# Patient Record
Sex: Female | Born: 2000 | Race: White | Hispanic: No | Marital: Single | Smoking: Never smoker
Health system: Southern US, Community
[De-identification: ages and names within clinical notes are randomized; demographics above are authoritative.]

## PROBLEM LIST (undated history)

## (undated) DIAGNOSIS — G43909 Migraine, unspecified, not intractable, without status migrainosus: Secondary | ICD-10-CM

---
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
## 2021-01-25 ENCOUNTER — Emergency Department
Admission: EM | Admit: 2021-01-25 | Discharge: 2021-01-25 | Disposition: A | Discharge status: Home / Self Care | Payer: Medicaid Other | Attending: Emergency Medicine | Admitting: Emergency Medicine

## 2021-01-25 ENCOUNTER — Other Ambulatory Visit: Payer: Self-pay

## 2021-01-25 DIAGNOSIS — R109 Unspecified abdominal pain: Secondary | ICD-10-CM | POA: Insufficient documentation

## 2021-01-25 DIAGNOSIS — Z5321 Procedure and treatment not carried out due to patient leaving prior to being seen by health care provider: Secondary | ICD-10-CM | POA: Diagnosis not present

## 2021-01-25 DIAGNOSIS — R1110 Vomiting, unspecified: Secondary | ICD-10-CM | POA: Diagnosis not present

## 2021-01-25 LAB — URINALYSIS, ROUTINE W REFLEX MICROSCOPIC
Bacteria, UA: NONE SEEN
Bilirubin Urine: NEGATIVE
Glucose, UA: NEGATIVE mg/dL
Ketones, ur: NEGATIVE mg/dL
Nitrite: NEGATIVE
Protein, ur: NEGATIVE mg/dL
Specific Gravity, Urine: 1.014 (ref 1.005–1.030)
pH: 6 (ref 5.0–8.0)

## 2021-01-25 LAB — COMPREHENSIVE METABOLIC PANEL
ALT: 30 U/L (ref 0–44)
AST: 23 U/L (ref 15–41)
Albumin: 4.1 g/dL (ref 3.5–5.0)
Alkaline Phosphatase: 55 U/L (ref 38–126)
Anion gap: 8 (ref 5–15)
BUN: 12 mg/dL (ref 6–20)
CO2: 23 mmol/L (ref 22–32)
Calcium: 9.9 mg/dL (ref 8.9–10.3)
Chloride: 106 mmol/L (ref 98–111)
Creatinine, Ser: 0.76 mg/dL (ref 0.44–1.00)
GFR, Estimated: >60 mL/min (ref >60)
Glucose, Bld: 91 mg/dL (ref 70–99)
Potassium: 3.3 mmol/L — ABNORMAL LOW (ref 3.5–5.1)
Sodium: 137 mmol/L (ref 135–145)
Total Bilirubin: 0.8 mg/dL (ref 0.3–1.2)
Total Protein: 7.9 g/dL (ref 6.5–8.1)

## 2021-01-25 LAB — CBC
HCT: 40.4 % (ref 36.0–46.0)
Hemoglobin: 14.1 g/dL (ref 12.0–15.0)
MCH: 32 pg (ref 26.0–34.0)
MCHC: 34.9 g/dL (ref 30.0–36.0)
MCV: 91.6 fL (ref 80.0–100.0)
Platelets: 319 10*3/uL (ref 150–400)
RBC: 4.41 MIL/uL (ref 3.87–5.11)
RDW: 11.5 % (ref 11.5–15.5)
WBC: 9.2 10*3/uL (ref 4.0–10.5)
nRBC: 0 % (ref 0.0–0.2)

## 2021-01-25 LAB — LIPASE, BLOOD: Lipase: 29 U/L (ref 11–51)

## 2021-01-25 LAB — POC URINE PREG, ED: Preg Test, Ur: NEGATIVE

## 2021-01-25 NOTE — ED Provider Notes (Signed)
Emergency Medicine Provider Triage Evaluation Note  Victoria Kline , a 20 y.o. female  was evaluated in triage.  Pt complains of left lower quadrant abdominal pain with vomiting that started Saturday.  Patient has had some mild increased urinary frequency but denies dysuria or hematuria.  Patient denies possibility of pregnancy stating that she uses the  Review of Systems  Positive: Patient has abdominal pain, vomiting and diarrhea.  Negative: No chest pain, chest tightness or abdominal pain.  Physical Exam  BP 123/88   Pulse 79   Temp 98.6 F (37 C) (Oral)   Resp 16   Ht 5\' 3"  (1.6 m)   Wt 47.6 kg   SpO2 100%   BMI 18.60 kg/m  Gen:   Awake, no distress   Resp:  Normal effort  MSK:   Moves extremities without difficulty  Other:    Medical Decision Making  Medically screening exam initiated at 4:54 PM.  Appropriate orders placed.  Prout was informed that the remainder of the evaluation will be completed by another provider, this initial triage assessment does not replace that evaluation, and the importance of remaining in the ED until their evaluation is complete.     Renee Rival Taft, PA-C 01/25/21 1657    01/27/21, MD 01/26/21 1500

## 2021-01-25 NOTE — ED Triage Notes (Signed)
Pt to ED for left mid abdominal pain and emesis since Saturday. Denies diarrhea.

## 2021-02-02 ENCOUNTER — Emergency Department: Payer: Medicaid Other

## 2021-02-02 ENCOUNTER — Other Ambulatory Visit: Payer: Self-pay

## 2021-02-02 ENCOUNTER — Emergency Department
Admission: EM | Admit: 2021-02-02 | Discharge: 2021-02-02 | Disposition: A | Discharge status: Home / Self Care | Payer: Medicaid Other | Attending: Emergency Medicine | Admitting: Emergency Medicine

## 2021-02-02 ENCOUNTER — Encounter: Payer: Self-pay | Admitting: Emergency Medicine

## 2021-02-02 DIAGNOSIS — R1032 Left lower quadrant pain: Secondary | ICD-10-CM | POA: Diagnosis present

## 2021-02-02 DIAGNOSIS — R102 Pelvic and perineal pain: Secondary | ICD-10-CM

## 2021-02-02 DIAGNOSIS — N132 Hydronephrosis with renal and ureteral calculous obstruction: Secondary | ICD-10-CM | POA: Insufficient documentation

## 2021-02-02 DIAGNOSIS — N200 Calculus of kidney: Secondary | ICD-10-CM

## 2021-02-02 LAB — COMPREHENSIVE METABOLIC PANEL
ALT: 24 U/L (ref 0–44)
AST: 26 U/L (ref 15–41)
Albumin: 4.6 g/dL (ref 3.5–5.0)
Alkaline Phosphatase: 70 U/L (ref 38–126)
Anion gap: 9 (ref 5–15)
BUN: 8 mg/dL (ref 6–20)
CO2: 23 mmol/L (ref 22–32)
Calcium: 9.8 mg/dL (ref 8.9–10.3)
Chloride: 104 mmol/L (ref 98–111)
Creatinine, Ser: 0.87 mg/dL (ref 0.44–1.00)
GFR, Estimated: >60 mL/min (ref >60)
Glucose, Bld: 108 mg/dL — ABNORMAL HIGH (ref 70–99)
Potassium: 3.9 mmol/L (ref 3.5–5.1)
Sodium: 136 mmol/L (ref 135–145)
Total Bilirubin: 0.5 mg/dL (ref 0.3–1.2)
Total Protein: 8.2 g/dL — ABNORMAL HIGH (ref 6.5–8.1)

## 2021-02-02 LAB — URINALYSIS, ROUTINE W REFLEX MICROSCOPIC
Bacteria, UA: NONE SEEN
Bilirubin Urine: NEGATIVE
Glucose, UA: NEGATIVE mg/dL
Ketones, ur: NEGATIVE mg/dL
Nitrite: NEGATIVE
Protein, ur: 30 mg/dL — AB
RBC / HPF: >50 RBC/hpf — ABNORMAL HIGH (ref 0–5)
Specific Gravity, Urine: 1.029 (ref 1.005–1.030)
pH: 5 (ref 5.0–8.0)

## 2021-02-02 LAB — CBC
HCT: 38.8 % (ref 36.0–46.0)
Hemoglobin: 12.9 g/dL (ref 12.0–15.0)
MCH: 30.5 pg (ref 26.0–34.0)
MCHC: 33.2 g/dL (ref 30.0–36.0)
MCV: 91.7 fL (ref 80.0–100.0)
Platelets: 312 10*3/uL (ref 150–400)
RBC: 4.23 MIL/uL (ref 3.87–5.11)
RDW: 11.6 % (ref 11.5–15.5)
WBC: 9.4 10*3/uL (ref 4.0–10.5)
nRBC: 0 % (ref 0.0–0.2)

## 2021-02-02 LAB — LIPASE, BLOOD: Lipase: 31 U/L (ref 11–51)

## 2021-02-02 LAB — POC URINE PREG, ED: Preg Test, Ur: NEGATIVE

## 2021-02-02 MED ORDER — HYDROCODONE-ACETAMINOPHEN 5-325 MG PO TABS: 1.0000 | ORAL_TABLET | Freq: Four times a day (QID) | ORAL | 0 refills | Status: AC | PRN

## 2021-02-02 MED ORDER — ONDANSETRON HCL 4 MG/2ML IJ SOLN
4.0000 mg | Freq: Once | INTRAMUSCULAR | Status: AC
  Administered 2021-02-02: 4 mg via INTRAVENOUS
  Filled 2021-02-02: qty 2

## 2021-02-02 MED ORDER — TAMSULOSIN HCL 0.4 MG PO CAPS: 0.4000 mg | ORAL_CAPSULE | Freq: Every day | ORAL | 0 refills | Status: AC

## 2021-02-02 MED ORDER — LACTATED RINGERS IV BOLUS
1000.0000 mL | Freq: Once | INTRAVENOUS | Status: AC
  Administered 2021-02-02: 1000 mL via INTRAVENOUS

## 2021-02-02 MED ORDER — MORPHINE SULFATE (PF) 4 MG/ML IV SOLN
4.0000 mg | Freq: Once | INTRAVENOUS | Status: AC
  Administered 2021-02-02: 4 mg via INTRAVENOUS
  Filled 2021-02-02: qty 1

## 2021-02-02 MED ORDER — ONDANSETRON HCL 4 MG/2ML IJ SOLN: 4.0000 mg | Freq: Once | INTRAMUSCULAR | Status: DC

## 2021-02-02 MED ORDER — KETOROLAC TROMETHAMINE 30 MG/ML IJ SOLN
15.0000 mg | Freq: Once | INTRAMUSCULAR | Status: AC
  Administered 2021-02-02: 15 mg via INTRAVENOUS
  Filled 2021-02-02: qty 1

## 2021-02-02 MED ORDER — IOHEXOL 300 MG/ML  SOLN
75.0000 mL | Freq: Once | INTRAMUSCULAR | Status: AC | PRN
  Administered 2021-02-02: 75 mL via INTRAVENOUS
  Filled 2021-02-02: qty 75

## 2021-02-02 NOTE — ED Provider Notes (Signed)
[facility] Emergency Department Provider Note  ____________________________________________   Event Date/Time   First MD Initiated Contact with Patient 02/02/21 1050     (approximate)  I have reviewed the triage vital signs and the nursing notes.   HISTORY  Chief Complaint Back Pain and Abdominal Pain   HPI Victoria Kline is a 20 y.o. female with a past medical history of migraine headaches who presents for assessment of some left lower quadrant abdominal pain as well as some right lower back pain and some blood in her urine and some nausea and vomiting today.  Patient states she was diagnosed with UTI couple days ago and has been taking Bactrim as prescribed but feels like her left lower quadrant abdominal pain got worse today and she started vomiting today.  She denies any burning with urination, other abnormal vaginal bleeding or discharge, chest pain, cough, fevers, headache, earache, sore throat, diarrhea, rash or extremity pain.  Patient unsure if she had shortness of breath today from the pain in her abdomen or not.  She does not get regular menstrual periods because she is on the Depo-Provera shot.  No other acute concerns at this time.         Past Medical History:  Diagnosis Date   Migraines     There are no problems to display for this patient.   History reviewed. No pertinent surgical history.  Prior to Admission medications   Medication Sig Start Date End Date Taking? Authorizing Provider  HYDROcodone-acetaminophen (NORCO) 5-325 MG tablet Take 1 tablet by mouth every 6 (six) hours as needed for up to 5 days for severe pain. 02/02/21 02/07/21 Yes Lucrezia Starch, MD  tamsulosin (FLOMAX) 0.4 MG CAPS capsule Take 1 capsule (0.4 mg total) by mouth daily for 5 days. 02/02/21 02/07/21 Yes Lucrezia Starch, MD    Allergies Patient has no known allergies.  No family history on file.  Social History Social History   Tobacco Use   Smoking  status: Never   Smokeless tobacco: Never  Vaping Use   Vaping Use: Never used  Substance Use Topics   Alcohol use: No   Drug use: Yes    Types: Marijuana    Comment: "Edible"    Review of Systems  Review of Systems  Constitutional:  Negative for chills and fever.  HENT:  Negative for sore throat.   Eyes:  Negative for pain.  Respiratory:  Positive for shortness of breath. Negative for cough and stridor.   Cardiovascular:  Negative for chest pain.  Gastrointestinal:  Positive for abdominal pain, nausea and vomiting.  Genitourinary:  Positive for hematuria.  Musculoskeletal:  Positive for back pain.  Skin:  Negative for rash.  Neurological:  Negative for seizures, loss of consciousness and headaches.  Psychiatric/Behavioral:  Negative for suicidal ideas.   All other systems reviewed and are negative.    ____________________________________________   PHYSICAL EXAM:  VITAL SIGNS: ED Triage Vitals  Enc Vitals Group     BP 02/02/21 0955 119/81     Pulse Rate 02/02/21 0955 86     Resp 02/02/21 0955 17     Temp 02/02/21 0955 98 F (36.7 C)     Temp Source 02/02/21 0955 Oral     SpO2 02/02/21 0955 97 %     Weight --      Height --      Head Circumference --      Peak Flow --  Pain Score 02/02/21 0959 8     Pain Loc --      Pain Edu? --      Excl. in [place]? --    Vitals:   02/02/21 0955 02/02/21 1248  BP: 119/81 119/81  Pulse: 86 86  Resp: 17 17  Temp: 98 F (36.7 C)   SpO2: 97% 97%   Physical Exam Vitals and nursing note reviewed.  Constitutional:      General: She is not in acute distress.    Appearance: She is well-developed.  HENT:     Head: Normocephalic and atraumatic.     Right Ear: External ear normal.     Left Ear: External ear normal.     Nose: Nose normal.     Mouth/Throat:     Mouth: Mucous membranes are dry.  Eyes:     Conjunctiva/sclera: Conjunctivae normal.  Cardiovascular:     Rate and Rhythm: Normal rate and regular rhythm.      Heart sounds: No murmur heard. Pulmonary:     Effort: Pulmonary effort is normal. No respiratory distress.     Breath sounds: Normal breath sounds.  Abdominal:     Palpations: Abdomen is soft.     Tenderness: There is generalized abdominal tenderness. There is no right CVA tenderness or left CVA tenderness.  Musculoskeletal:     Cervical back: Neck supple.     Right lower leg: No edema.     Left lower leg: No edema.  Skin:    General: Skin is warm and dry.     Capillary Refill: Capillary refill takes 2 to 3 seconds.  Neurological:     Mental Status: She is alert and oriented to person, place, and time.  Psychiatric:        Mood and Affect: Mood normal.     ____________________________________________   LABS (all labs ordered are listed, but only abnormal results are displayed)  Labs Reviewed  COMPREHENSIVE METABOLIC PANEL - Abnormal; Notable for the following components:      Result Value   Glucose, Bld 108 (*)    Total Protein 8.2 (*)    All other components within normal limits  URINALYSIS, ROUTINE W REFLEX MICROSCOPIC - Abnormal; Notable for the following components:   Color, Urine YELLOW (*)    APPearance HAZY (*)    Hgb urine dipstick MODERATE (*)    Protein, ur 30 (*)    Leukocytes,Ua TRACE (*)    RBC / HPF >50 (*)    All other components within normal limits  URINE CULTURE  LIPASE, BLOOD  CBC  POC URINE PREG, ED   ____________________________________________  EKG  ____________________________________________  RADIOLOGY  ED MD interpretation: Pelvic ultrasound has no evidence of torsion, free fluid adnexal mass or cyst.  CT abdomen pelvis shows some moderate left hydronephrosis from a 3 mm calcified density in distal course of the left ureter consistent with stone causing some obstruction.  Left kidney is enlarged with some decreased cortical enhancement without significant stranding.  Right kidney is unremarkable.  There is no evidence of appendicitis,  diverticulitis, cholecystitis, pancreatitis or other acute abdominal pelvic process.  Official radiology report(s): CT ABDOMEN PELVIS W CONTRAST  Result Date: 02/02/2021 CLINICAL DATA:  Abdominal pain EXAM: CT ABDOMEN AND PELVIS WITH CONTRAST TECHNIQUE: Multidetector CT imaging of the abdomen and pelvis was performed using the standard protocol following bolus administration of intravenous contrast. CONTRAST:  41mL OMNIPAQUE IOHEXOL 300 MG/ML  SOLN COMPARISON:  None. FINDINGS: Lower chest: No significant  abnormality is seen Hepatobiliary: Liver measures 17.2 cm in length. No focal abnormality is seen. Gallbladder is unremarkable. There is no dilation of bile ducts. Pancreas: No focal abnormality is seen Spleen: Unremarkable Adrenals/Urinary Tract: Adrenals are not enlarged. Left kidney is larger than right. There is decreased cortical enhancement in the left kidney in comparison to the right side. There is moderate left hydronephrosis. Left ureter is dilated. In the image 66 of series 2, there is 3 mm calcific density in the distal course of left ureter close to the ureterovesical junction. Dilated ureter is difficult to visualize in the distal course of left ureter in the axial images. In coronal images this calcific density appears to be in the distal course of left ureter, most likely ureteral calculus. There are no demonstrable renal stones. Stomach/Bowel: Stomach is not distended. Small bowel loops are not dilated. Appendix is difficult to visualize. There is a tubular structure with air in the lumen in right pelvic cavity, possibly normal appendix. There is no pericecal inflammation. There is no significant wall thickening in colon. Vascular/Lymphatic: Unremarkable. Reproductive: Unremarkable. Other: There is no ascites or pneumoperitoneum. Musculoskeletal: Unremarkable IMPRESSION: There is moderate left hydronephrosis. There is 3 mm calcific density in the distal course of left ureter close to the  ureterovesical junction, most likely calculus in the distal left ureter causing high-grade obstruction. Left kidney is enlarged in size in comparison to the right with decreased cortical enhancement. This may be due to high-grade distal ureteric obstruction or superimposed pyelonephritis. There is no perinephric fluid collection. There is no evidence of intestinal obstruction or pneumoperitoneum. Electronically Signed   By: Elmer Picker M.D.   On: 02/02/2021 13:10   US PELVIC COMPLETE W TRANSVAGINAL AND TORSION R/O  Result Date: 02/02/2021 CLINICAL DATA:  A 20 year old female presents with LEFT pelvic pain for 1 week. EXAM: TRANSABDOMINAL AND TRANSVAGINAL ULTRASOUND OF PELVIS DOPPLER ULTRASOUND OF OVARIES TECHNIQUE: Both transabdominal and transvaginal ultrasound examinations of the pelvis were performed. Transabdominal technique was performed for global imaging of the pelvis including uterus, ovaries, adnexal regions, and pelvic cul-de-sac. It was necessary to proceed with endovaginal exam following the transabdominal exam to visualize the endometrium as well as LEFT and RIGHT ovary. Color and duplex Doppler ultrasound was utilized to evaluate blood flow to the ovaries. COMPARISON:  None. FINDINGS: Uterus Measurements: 5.8 x 2.5 x 4.3 cm = volume: 32.7 mL. No fibroids or other mass visualized. Endometrium Thickness: 2 mm.  No focal abnormality visualized. Right ovary Measurements: 2.8 x 1.9 x 2.3 cm = volume: 6.2 mL. Normal appearance/no adnexal mass. Left ovary Measurements: 2.2 x 1.3 x 2.5 cm = volume: 3.7 mL. Normal appearance/no adnexal mass. Pulsed Doppler evaluation of both ovaries demonstrates normal low-resistance arterial and venous waveforms. Other findings No abnormal free fluid. IMPRESSION: Normal pelvic sonogram.  No signs of ovarian torsion. Electronically Signed   By: Zetta Bills M.D.   On: 02/02/2021 12:22     ____________________________________________   PROCEDURES  Procedure(s) performed (including Critical Care):  Procedures   ____________________________________________   INITIAL IMPRESSION / ASSESSMENT AND PLAN / ED COURSE      Patient presents with above-stated history exam for assessment of nausea vomiting and left lower quad abdominal pain as well some right lower back pain and hematuria.  On arrival she is afebrile hemodynamically stable.  She does have some mild left lower quadrant abdominal without any significant CVA tenderness  Primary differential includes kidney stone, pyelonephritis, ovarian torsion, ovarian cyst, TOA, diverticulitis, appendicitis  and cystitis.  Pelvic ultrasound has no evidence of torsion, free fluid adnexal mass or cyst.  CT abdomen pelvis shows some moderate left hydronephrosis from a 3 mm calcified density in distal course of the left ureter consistent with stone causing some obstruction.  Left kidney is enlarged with some decreased cortical enhancement without significant stranding.  Right kidney is unremarkable.  There is no evidence of appendicitis, diverticulitis, cholecystitis, pancreatitis or other acute abdominal pelvic process.  Lipase not consistent with acute pancreatitis.  CMP without any significant electrolyte metabolic derangements.  There is no evidence of hepatitis or cholestasis.  CBC shows no leukocytosis or acute anemia.  Pregnancy test is negative.  UA with moderate hemoglobin and some protein and trace leukocyte esterase with urine 50 RBCs.  There are no WBCs, bacteria seen, or nitrites.  Given it has been several days since patient symptoms began and she does not seem to spontaneously passed a stone which I suspect is likely the cause of her symptoms I did reach out to on-call urologist Dr. Lonna Cobb recommended starting patient on empty Zosyn and following up in clinic.  I think this is reasonable as do not believe she is septic at  this time her pain and nausea are well controlled on my assessment.  She is able to tolerate p.o.  Her kidney function is appropriate.  Urine at this time does not appear infected.  Rx for Flomax and analgesia provided.  She states she still has enough Zofran does not need a refill.  Discharged stable condition.  Strict return precautions advised and discussed.  Plan is to follow-up with urology next 2 or 3 days.      ____________________________________________   FINAL CLINICAL IMPRESSION(S) / ED DIAGNOSES  Final diagnoses:  Pelvic pain  Kidney stone    Medications  ondansetron (ZOFRAN) injection 4 mg (4 mg Intravenous Not Given 02/02/21 1211)  morphine 4 MG/ML injection 4 mg (4 mg Intravenous Given 02/02/21 1116)  ondansetron (ZOFRAN) injection 4 mg (4 mg Intravenous Given 02/02/21 1116)  ondansetron (ZOFRAN) injection 4 mg (4 mg Intravenous Given 02/02/21 1213)  lactated ringers bolus 1,000 mL (1,000 mLs Intravenous New Bag/Given 02/02/21 1246)  iohexol (OMNIPAQUE) 300 MG/ML solution 75 mL (75 mLs Intravenous Contrast Given 02/02/21 1252)  ketorolac (TORADOL) 30 MG/ML injection 15 mg (15 mg Intravenous Given 02/02/21 1335)     ED Discharge Orders          Ordered    tamsulosin (FLOMAX) 0.4 MG CAPS capsule  Daily        02/02/21 1333    HYDROcodone-acetaminophen (NORCO) 5-325 MG tablet  Every 6 hours PRN        02/02/21 1333             Note:  This document was prepared using Dragon voice recognition software and may include unintentional dictation errors.    Gilles Chiquito, MD 02/02/21 [phone]

## 2021-02-02 NOTE — Discharge Instructions (Addendum)
I recommend taking 400 mg ibuprofen every 6 hours with using your prescription pain medicine only as needed for breakthrough pain.

## 2021-02-02 NOTE — ED Notes (Signed)
Lab contacted about discontinued urinalysis. Per lab tech, urine needs to be recollected.

## 2021-02-02 NOTE — ED Triage Notes (Signed)
Pt states that she is having Left mid quad abd pain with back pain, the back pain takes her breath away at times. She was seen here about a week ago for the same but left because of the wait, went to [facility] they diagnosised her with a UTI and put on antibiotics that she feels are not helping her.

## 2021-02-04 LAB — URINE CULTURE: Culture: 10000 — AB

## 2021-02-05 NOTE — Progress Notes (Signed)
ED Antimicrobial Stewardship Positive Culture Follow Up   Victoria Kline is an 20 y.o. female who presented to [facility] on 02/02/2021 with a chief complaint of  Chief Complaint  Patient presents with   Back Pain   Abdominal Pain    Recent Results (from the past 720 hour(s))  Urine Culture     Status: Abnormal   Collection Time: 02/02/21 11:03 AM   Specimen: Urine, Random  Result Value Ref Range Status   Specimen Description   Final    URINE, RANDOM Performed at [facility], [address] [place], [place] [address]    Special Requests   Final    NONE Performed at [facility], [address] [place], [place] [address]    Culture 10,000 COLONIES/mL STAPHYLOCOCCUS EPIDERMIDIS (A)  Final   Report Status 02/04/2021 FINAL  Final   Organism ID, Bacteria STAPHYLOCOCCUS EPIDERMIDIS (A)  Final      Susceptibility   Staphylococcus epidermidis - MIC*    CIPROFLOXACIN <=0.5 SENSITIVE Sensitive     GENTAMICIN <=0.5 SENSITIVE Sensitive     NITROFURANTOIN <=16 SENSITIVE Sensitive     OXACILLIN <=0.25 SENSITIVE Sensitive     TETRACYCLINE 4 SENSITIVE Sensitive     VANCOMYCIN 2 SENSITIVE Sensitive     TRIMETH/SULFA 160 RESISTANT Resistant     CLINDAMYCIN <=0.25 SENSITIVE Sensitive     RIFAMPIN <=0.5 SENSITIVE Sensitive     Inducible Clindamycin NEGATIVE Sensitive     * 10,000 COLONIES/mL STAPHYLOCOCCUS EPIDERMIDIS    [x]  Patient discharged originally without antimicrobial agent and treatment may now be indicated.  Found to have ureteral stone.  Discussed case with Dr in ED,  MSSE would be unusual organism in the setting of no catheter or urinary tract procedure, etc.but physician suggested contacted patient to see how she is feeling.  I have attempted to make 3 phone calls today.  A message was left on patient's voicemail.    New antibiotic prescription: none at this time.  Attempting to contact patient.  If signs of infection then will start cephalexin 500mg   QID x 5 days.    ED Provider: Dr Vicente Males, PharmD, BCPS.   Work Cell: [phone] 02/05/2021 11:25 AM

## 2022-05-18 ENCOUNTER — Other Ambulatory Visit: Payer: Self-pay

## 2022-05-18 ENCOUNTER — Emergency Department
Admission: EM | Admit: 2022-05-18 | Discharge: 2022-05-18 | Discharge status: Absent without leave | Payer: Medicaid Other

## 2022-05-18 NOTE — ED Triage Notes (Deleted)
Pt c/o lower back pain that started approx 1 week ago. Denies any injury or strain. Pt ambulated with steady gait.

## 2022-12-01 IMAGING — US US PELVIS COMPLETE TRANSABD/TRANSVAG W DUPLEX
1 series · 13 of 25 positions shown · non-contrast
Comparison: None.

CLINICAL DATA: A 20-year-old female presents with LEFT pelvic pain
for 1 week.

EXAM:
TRANSABDOMINAL AND TRANSVAGINAL ULTRASOUND OF PELVIS
DOPPLER ULTRASOUND OF OVARIES
TECHNIQUE: Both transabdominal and transvaginal ultrasound examinations of the
pelvis were performed. Transabdominal technique was performed for
global imaging of the pelvis including uterus, ovaries, adnexal
regions, and pelvic cul-de-sac.
It was necessary to proceed with endovaginal exam following the
transabdominal exam to visualize the endometrium as well as LEFT and
RIGHT ovary. Color and duplex Doppler ultrasound was utilized to
evaluate blood flow to the ovaries.

[Series 1: us pelvic complete w transvaginal and torsion righ · 13 of 114 slices shown]
[im 1/114]
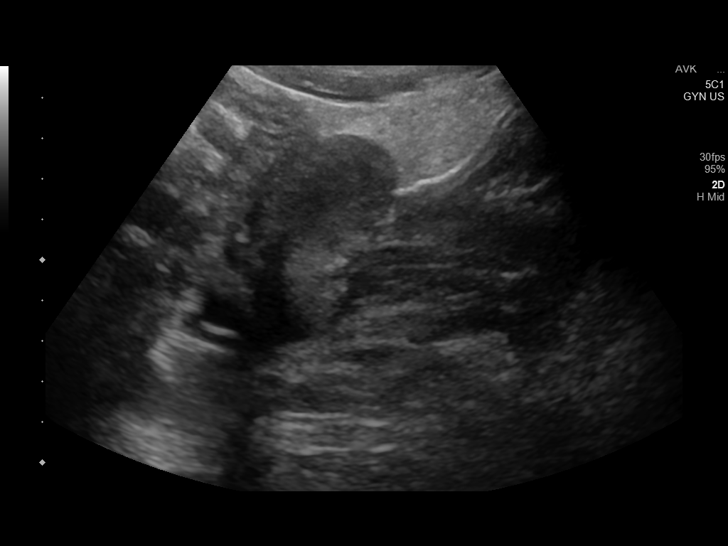
[im 10/114]
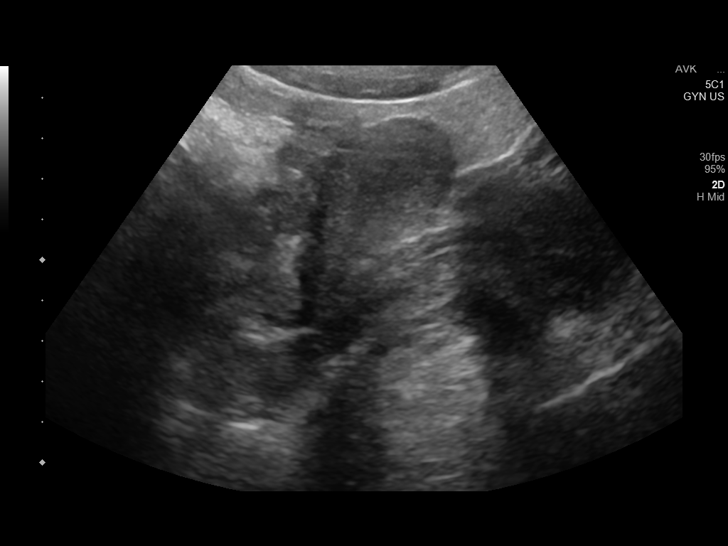
[im 19/114]
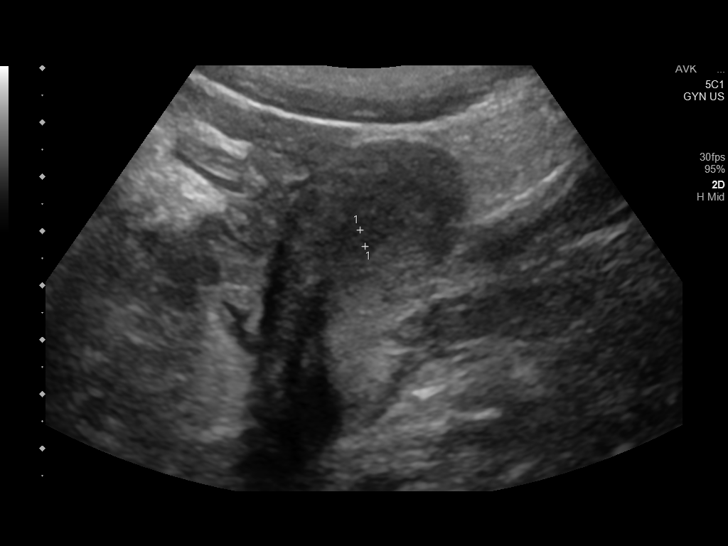
[im 29/114]
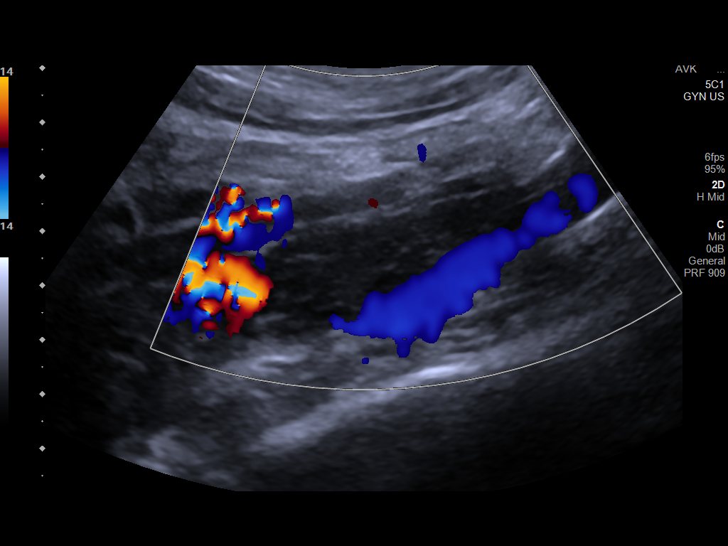
[im 38/114]
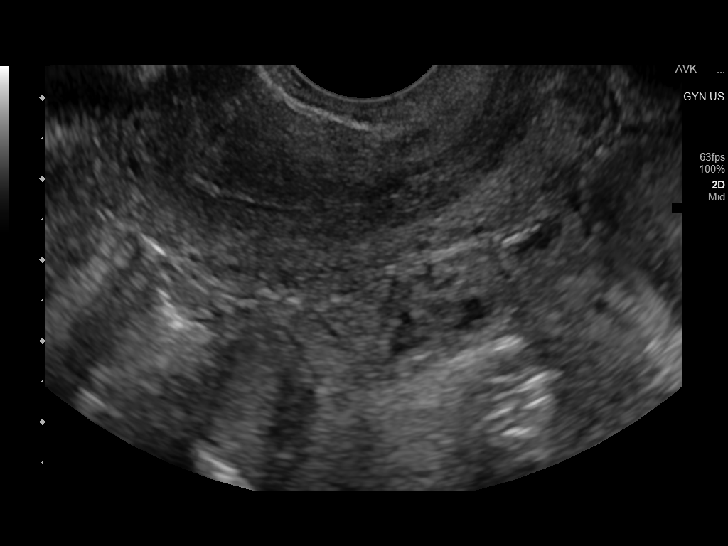
[im 48/114]
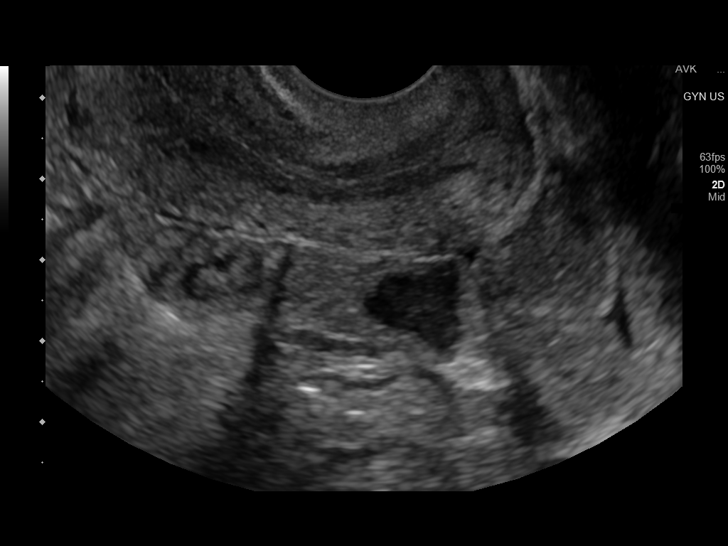
[im 57/114]
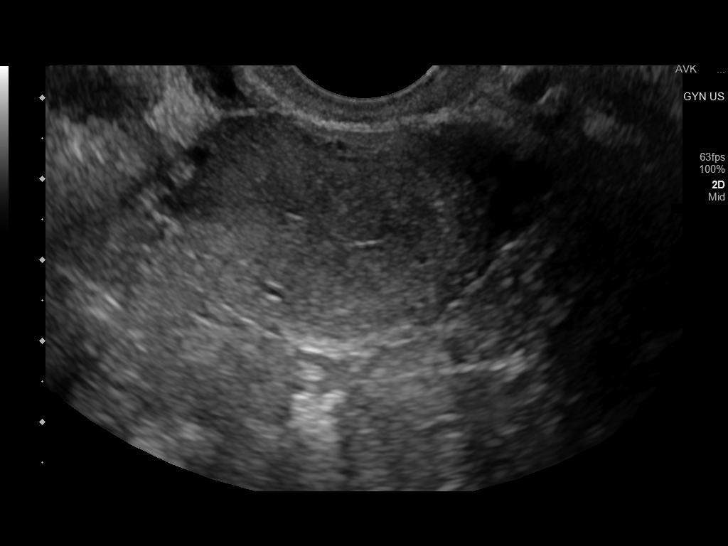
[im 66/114]
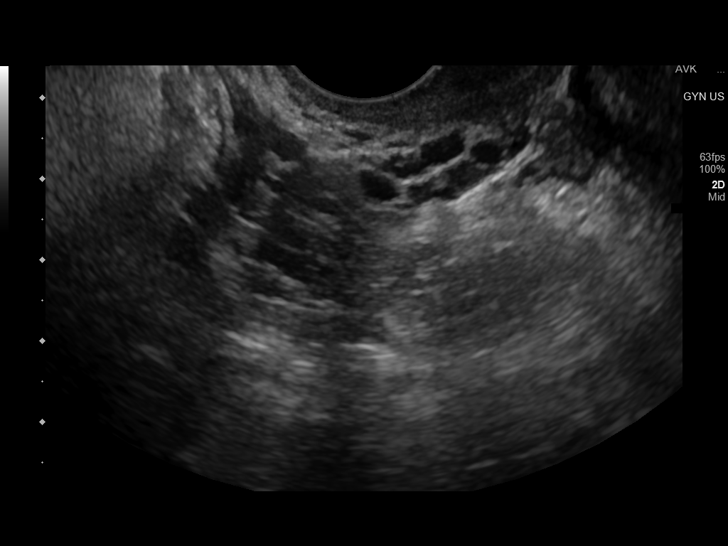
[im 76/114]
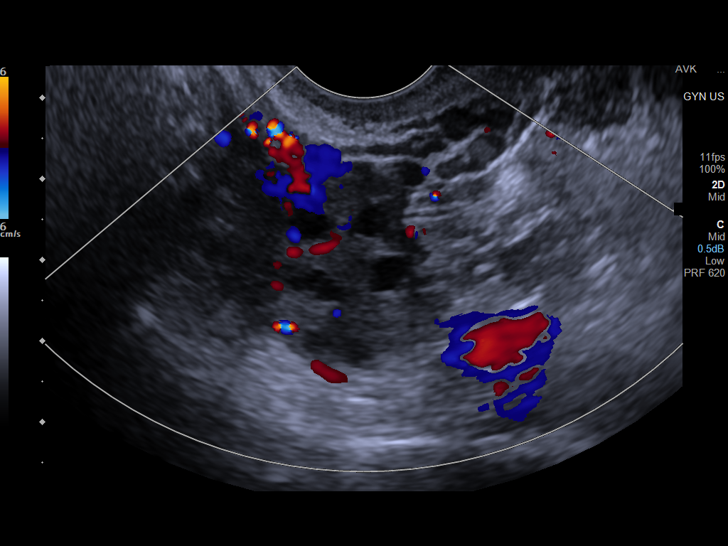
[im 85/114]
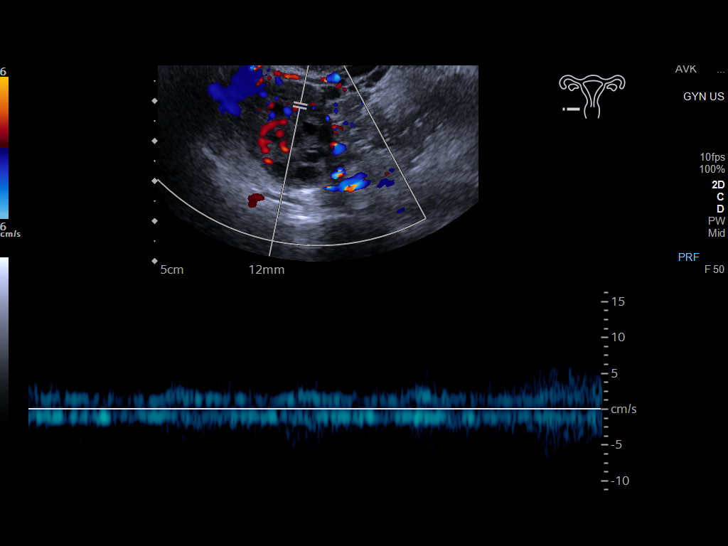
[im 95/114]
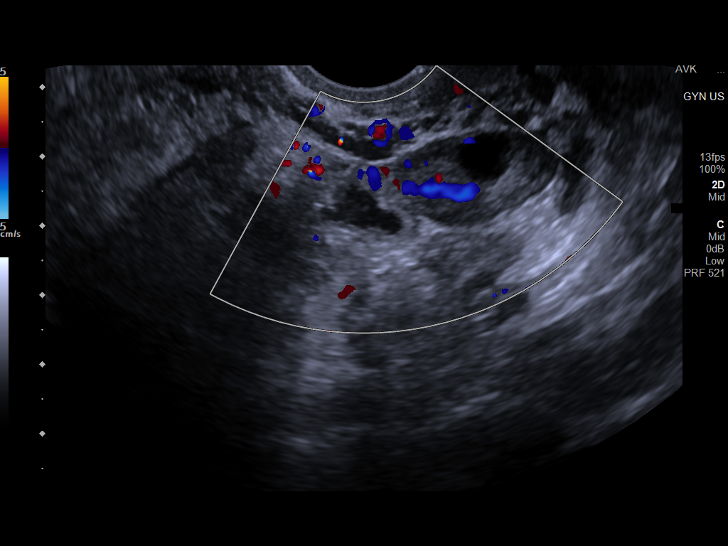
[im 104/114]
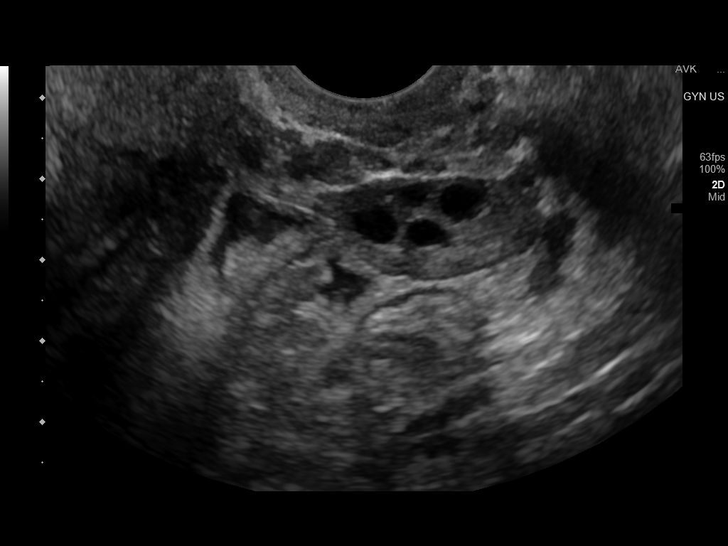
[im 114/114]
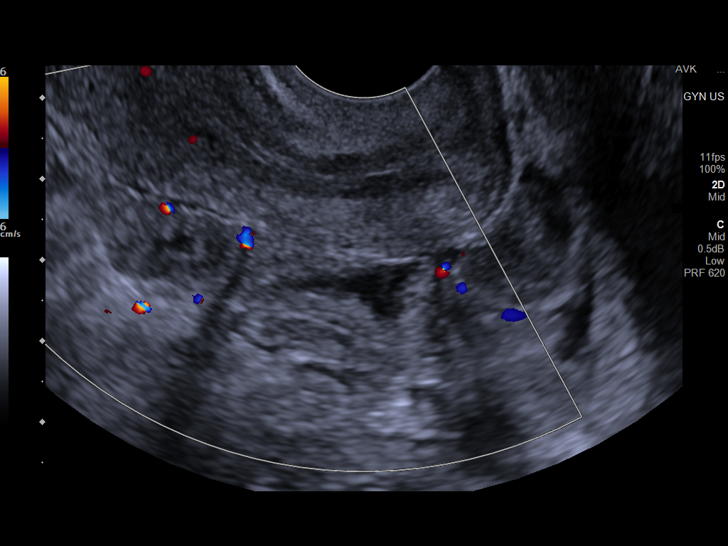

[13 of 25 positions shown; findings below may reference images not displayed]

FINDINGS: Uterus

Measurements: 5.8 x 2.5 x 4.3 cm = volume: 32.7 mL. No fibroids or
other mass visualized.

Endometrium

Thickness: 2 mm.  No focal abnormality visualized.

Right ovary

Measurements: 2.8 x 1.9 x 2.3 cm = volume: 6.2 mL. Normal
appearance/no adnexal mass.

Left ovary

Measurements: 2.2 x 1.3 x 2.5 cm = volume: 3.7 mL. Normal
appearance/no adnexal mass.

Pulsed Doppler evaluation of both ovaries demonstrates normal
low-resistance arterial and venous waveforms.

Other findings

No abnormal free fluid.
IMPRESSION: Normal pelvic sonogram.  No signs of ovarian torsion.
